# Patient Record
Sex: Female | Born: 1975 | Race: White | Hispanic: No | Marital: Married | Smoking: Never smoker
Health system: Southern US, Community
[De-identification: ages and names within clinical notes are randomized; demographics above are authoritative.]

---

## 2018-07-28 ENCOUNTER — Emergency Department
Admission: EM | Admit: 2018-07-28 | Discharge: 2018-07-28 | Disposition: A | Discharge status: Home / Self Care | Payer: Self-pay | Source: Home / Self Care | Attending: Family Medicine | Admitting: Family Medicine

## 2018-07-28 ENCOUNTER — Other Ambulatory Visit: Payer: Self-pay

## 2018-07-28 DIAGNOSIS — J029 Acute pharyngitis, unspecified: Secondary | ICD-10-CM

## 2018-07-28 DIAGNOSIS — R69 Illness, unspecified: Secondary | ICD-10-CM

## 2018-07-28 DIAGNOSIS — J111 Influenza due to unidentified influenza virus with other respiratory manifestations: Secondary | ICD-10-CM

## 2018-07-28 LAB — POCT RAPID STREP A (OFFICE): Rapid Strep A Screen: NEGATIVE

## 2018-07-28 MED ORDER — OSELTAMIVIR PHOSPHATE 75 MG PO CAPS: 75.0000 mg | ORAL_CAPSULE | Freq: Two times a day (BID) | ORAL | 0 refills | Status: DC

## 2018-07-28 MED ORDER — BENZONATATE 200 MG PO CAPS: ORAL_CAPSULE | ORAL | 0 refills | Status: DC

## 2018-07-28 NOTE — Discharge Instructions (Addendum)
Take plain guaifenesin (1200mg  extended release tabs such as Mucinex) twice daily, with plenty of water, for cough and congestion.  May add Pseudoephedrine (30mg , one or two every 4 to 6 hours) for sinus congestion.  Get adequate rest.   May use Afrin nasal spray (or generic oxymetazoline) each morning for about 5 days and then discontinue.  Also recommend using saline nasal spray several times daily and saline nasal irrigation (AYR is a common brand).  Use Flonase nasal spray each morning after using Afrin nasal spray and saline nasal irrigation. Try warm salt water gargles for sore throat.  Stop all antihistamines for now, and other non-prescription cough/cold preparations. May take Ibuprofen 200mg , 4 tabs every 8 hours with food for body aches, headache, fever. May take Delsym Cough Suppressant with Tessalon at bedtime for nighttime cough.

## 2018-07-28 NOTE — ED Triage Notes (Signed)
Pt c/o flu like sxs since Wednesday. Throat feels very raw from coughing. Requested rapid strep as well to r/o strep since she works in a [employer].

## 2018-07-28 NOTE — ED Provider Notes (Signed)
[facility]    CSN: [id number] Arrival date & time: 07/28/18  1546     History   Chief Complaint Chief Complaint  Patient presents with  . Sore Throat    HPI Meghan Arias is a 43 y.o. female.   Three days ago patient developed myalgias, light-headedness, headache, fatigue, non-productive cough, and fever.  She now has a sore throat when coughing, and has had fever to 101.8.     History reviewed. No pertinent past medical history.  There are no active problems to display for this patient.   History reviewed. No pertinent surgical history.  OB History   No obstetric history on file.      Home Medications    Prior to Admission medications   Medication Sig Start Date End Date Taking? Authorizing Provider  benzonatate (TESSALON) 200 MG capsule Take one cap by mouth at bedtime as needed for cough.  May repeat in 4 to 6 hours 07/28/18   Lattie Haw, MD  oseltamivir (TAMIFLU) 75 MG capsule Take 1 capsule (75 mg total) by mouth every 12 (twelve) hours. 07/28/18   Lattie Haw, MD    Family History History reviewed. No pertinent family history.  Social History Social History   Tobacco Use  . Smoking status: Never Smoker  . Smokeless tobacco: Never Used  Substance Use Topics  . Alcohol use: Never    Frequency: Never  . Drug use: Not on file     Allergies   Patient has no known allergies.   Review of Systems Review of Systems + sore throat + cough No pleuritic pain No wheezing + nasal congestion + post-nasal drainage No sinus pain/pressure No itchy/red eyes No earache No hemoptysis No SOB + fever, + chills No nausea No vomiting No abdominal pain No diarrhea No urinary symptoms No skin rash + fatigue + myalgias + headache Used OTC meds without relief   Physical Exam Triage Vital Signs ED Triage Vitals [07/28/18 1612]  Enc Vitals Group     BP 121/87     Pulse Rate 89     Resp      Temp 98.2 F (36.8 C)     Temp  Source Oral     SpO2 100 %     Weight 241 lb (109.3 kg)     Height 5\' 5"  (1.651 m)     Head Circumference      Peak Flow      Pain Score 0     Pain Loc      Pain Edu?      Excl. in GC?    No data found.  Updated Vital Signs BP 121/87 (BP Location: Right Arm)   Pulse 89   Temp 98.2 F (36.8 C) (Oral)   Ht 5\' 5"  (1.651 m)   Wt 109.3 kg   LMP  (LMP Unknown)   SpO2 100%   BMI 40.10 kg/m   Visual Acuity Right Eye Distance:   Left Eye Distance:   Bilateral Distance:    Right Eye Near:   Left Eye Near:    Bilateral Near:     Physical Exam Nursing notes and Vital Signs reviewed. Appearance:  Patient appears stated age, and in no acute distress Eyes:  Pupils are equal, round, and reactive to light and accomodation.  Extraocular movement is intact.  Conjunctivae are not inflamed  Ears:  Canals normal.  Tympanic membranes normal.  Nose:  Mildly congested turbinates.  No sinus tenderness.  Pharynx:   Minimal erythema. Neck:  Supple.  Enlarged posterior/lateral nodes are palpated bilaterally, tender to palpation on the left.   Lungs:  Clear to auscultation.  Breath sounds are equal.  Moving air well. Heart:  Regular rate and rhythm without murmurs, rubs, or gallops.  Abdomen:  Nontender without masses or hepatosplenomegaly.  Bowel sounds are present.  No CVA or flank tenderness.  Extremities:  No edema.  Skin:  No rash present.    UC Treatments / Results  Labs (all labs ordered are listed, but only abnormal results are displayed) Labs Reviewed  STREP A DNA PROBE  POCT RAPID STREP A (OFFICE) negative    EKG None  Radiology No results found.  Procedures Procedures (including critical care time)  Medications Ordered in UC Medications - No data to display  Initial Impression / Assessment and Plan / UC Course  I have reviewed the triage vital signs and the nursing notes.  Pertinent labs & imaging results that were available during my care of the patient were  reviewed by me and considered in my medical decision making (see chart for details).    Begin Tamiflu. Prescription written for Benzonatate [facility]) to take at bedtime for night-time cough.  Followup with Family Doctor if not improved in about 5 days.  Final Clinical Impressions(s) / UC Diagnoses   Final diagnoses:  Acute pharyngitis, unspecified etiology  Influenza-like illness     Discharge Instructions     Take plain guaifenesin (1200mg  extended release tabs such as Mucinex) twice daily, with plenty of water, for cough and congestion.  May add Pseudoephedrine (30mg , one or two every 4 to 6 hours) for sinus congestion.  Get adequate rest.   May use Afrin nasal spray (or generic oxymetazoline) each morning for about 5 days and then discontinue.  Also recommend using saline nasal spray several times daily and saline nasal irrigation (AYR is a common brand).  Use Flonase nasal spray each morning after using Afrin nasal spray and saline nasal irrigation. Try warm salt water gargles for sore throat.  Stop all antihistamines for now, and other non-prescription cough/cold preparations. May take Ibuprofen 200mg , 4 tabs every 8 hours with food for body aches, headache, fever. May take Delsym Cough Suppressant with Tessalon at bedtime for nighttime cough.     ED Prescriptions    Medication Sig Dispense Auth. Provider   oseltamivir (TAMIFLU) 75 MG capsule Take 1 capsule (75 mg total) by mouth every 12 (twelve) hours. 10 capsule Lattie Haw, MD   benzonatate (TESSALON) 200 MG capsule Take one cap by mouth at bedtime as needed for cough.  May repeat in 4 to 6 hours 15 capsule Cathren Harsh Tera Mater, MD        Lattie Haw, MD 08/09/18 [phone]

## 2018-07-29 ENCOUNTER — Telehealth: Payer: Self-pay

## 2018-07-29 LAB — STREP A DNA PROBE: Group A Strep Probe: NOT DETECTED

## 2018-07-29 NOTE — Telephone Encounter (Signed)
Spoke with patient, doing about the same.  Will follow up as needed.

## 2018-09-26 ENCOUNTER — Other Ambulatory Visit: Payer: Self-pay

## 2018-09-26 ENCOUNTER — Emergency Department (INDEPENDENT_AMBULATORY_CARE_PROVIDER_SITE_OTHER): Payer: Self-pay

## 2018-09-26 ENCOUNTER — Encounter: Payer: Self-pay | Admitting: Emergency Medicine

## 2018-09-26 ENCOUNTER — Emergency Department
Admission: EM | Admit: 2018-09-26 | Discharge: 2018-09-26 | Disposition: A | Discharge status: Home / Self Care | Payer: Self-pay | Source: Home / Self Care | Attending: Family Medicine | Admitting: Family Medicine

## 2018-09-26 DIAGNOSIS — K760 Fatty (change of) liver, not elsewhere classified: Secondary | ICD-10-CM

## 2018-09-26 DIAGNOSIS — M7918 Myalgia, other site: Secondary | ICD-10-CM

## 2018-09-26 DIAGNOSIS — R0781 Pleurodynia: Secondary | ICD-10-CM

## 2018-09-26 LAB — POCT CBC W AUTO DIFF (K'VILLE URGENT CARE)

## 2018-09-26 LAB — POCT URINALYSIS DIP (MANUAL ENTRY)
Bilirubin, UA: NEGATIVE
Blood, UA: NEGATIVE
Glucose, UA: NEGATIVE mg/dL
Ketones, POC UA: NEGATIVE mg/dL
Leukocytes, UA: NEGATIVE
Nitrite, UA: NEGATIVE
Protein Ur, POC: NEGATIVE mg/dL
Spec Grav, UA: 1.015 (ref 1.010–1.025)
Urobilinogen, UA: 0.2 E.U./dL
pH, UA: 5.5 (ref 5.0–8.0)

## 2018-09-26 MED ORDER — IOHEXOL 300 MG/ML  SOLN
100.0000 mL | Freq: Once | INTRAMUSCULAR | Status: AC | PRN
  Administered 2018-09-26: 16:00:00 100 mL via INTRAVENOUS

## 2018-09-26 NOTE — ED Triage Notes (Signed)
Pain under LT breast, 1 month intermittently, sharp pain

## 2018-09-26 NOTE — Discharge Instructions (Signed)
Wear elastic brace on abdomen.
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
## 2018-09-27 ENCOUNTER — Telehealth: Payer: Self-pay

## 2018-09-27 LAB — COMPLETE METABOLIC PANEL WITH GFR
AG Ratio: 1.8 (calc) (ref 1.0–2.5)
ALT: 43 U/L — ABNORMAL HIGH (ref 6–29)
AST: 32 U/L — ABNORMAL HIGH (ref 10–30)
Albumin: 4.4 g/dL (ref 3.6–5.1)
Alkaline phosphatase (APISO): 43 U/L (ref 31–125)
BUN: 12 mg/dL (ref 7–25)
CO2: 29 mmol/L (ref 20–32)
Calcium: 9 mg/dL (ref 8.6–10.2)
Chloride: 102 mmol/L (ref 98–110)
Creat: 0.71 mg/dL (ref 0.50–1.10)
GFR, Est African American: 120 mL/min/{1.73_m2} (ref >=60)
GFR, Est Non African American: 104 mL/min/{1.73_m2} (ref >=60)
Globulin: 2.5 g/dL (calc) (ref 1.9–3.7)
Glucose, Bld: 80 mg/dL (ref 65–99)
Potassium: 4.9 mmol/L (ref 3.5–5.3)
Sodium: 136 mmol/L (ref 135–146)
Total Bilirubin: 0.4 mg/dL (ref 0.2–1.2)
Total Protein: 6.9 g/dL (ref 6.1–8.1)

## 2018-09-27 LAB — EXTRA LAV TOP TUBE

## 2018-09-27 NOTE — Telephone Encounter (Signed)
Left voice message inquiring about patients status. Given lab results. Encouraged patient to call with questions or concerns.  

## 2019-10-16 IMAGING — CT CT ABDOMEN AND PELVIS WITH CONTRAST
2 of 5 series · 16 of 46 positions shown, 18 images · IV contrast (APPLIED)
Comparison: None.

CLINICAL DATA: Pain and bulging of the left breast when straining
or lifting for the past month.

EXAM:
CT ABDOMEN AND PELVIS WITH CONTRAST
TECHNIQUE: Multidetector CT imaging of the abdomen and pelvis was performed
using the standard protocol following bolus administration of
intravenous contrast.
CONTRAST:  100mL OMNIPAQUE IOHEXOL 300 MG/ML  SOLN

[Series 2: axial st · axial · 0.97mm/px · z∈[-636,-136]mm · 13 of 114 slices shown, 15 images]
[im 7/114  soft-tissue]
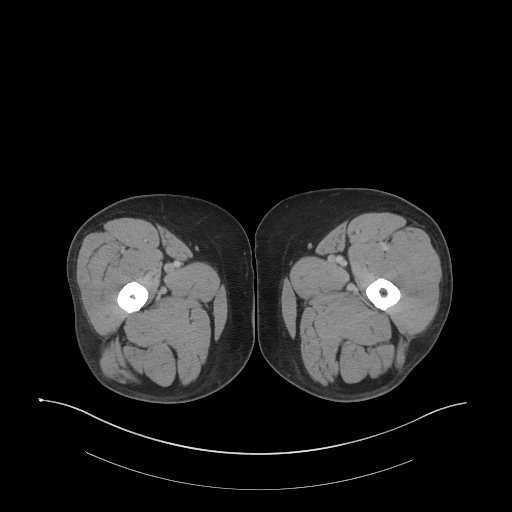
[im 7/114  bone]
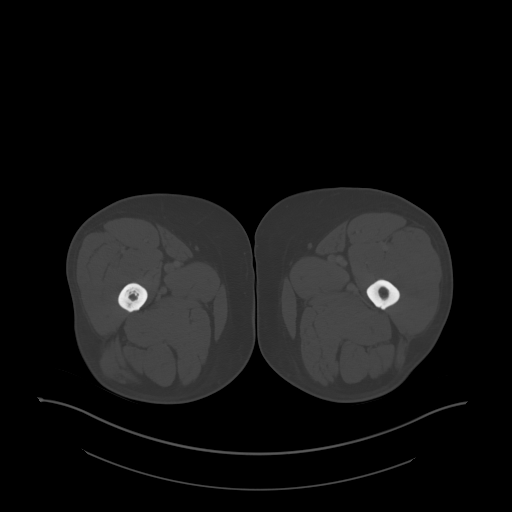
[im 13/114  soft-tissue]
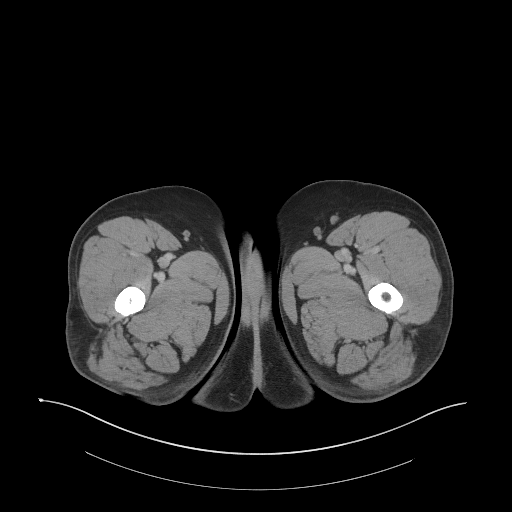
[im 26/114  soft-tissue]
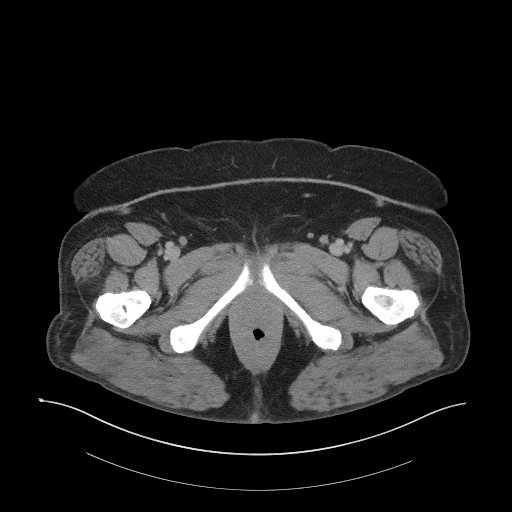
[im 32/114  soft-tissue]
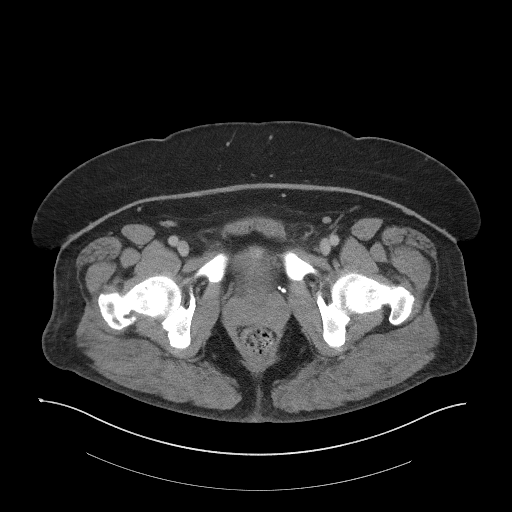
[im 38/114  soft-tissue]
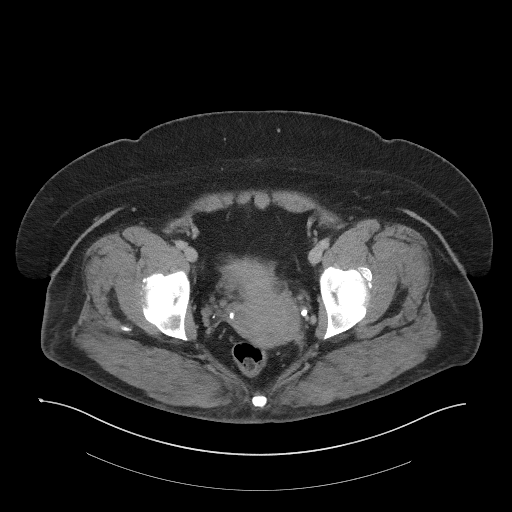
[im 51/114  soft-tissue]
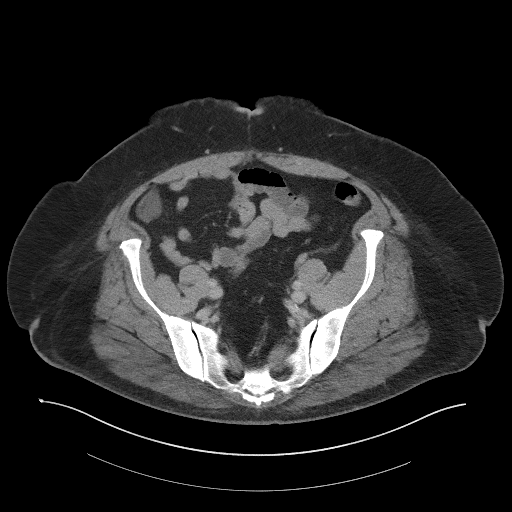
[im 57/114  soft-tissue]
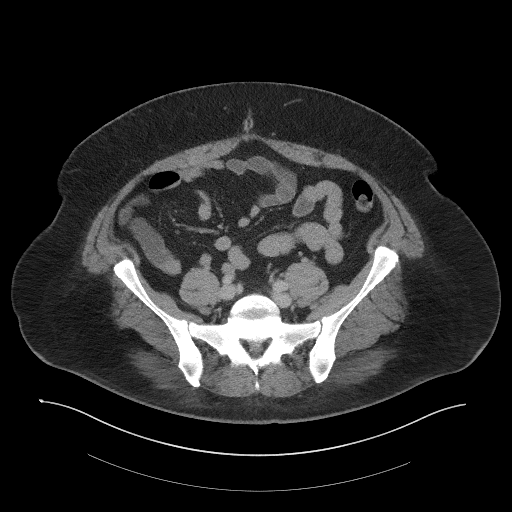
[im 63/114  soft-tissue]
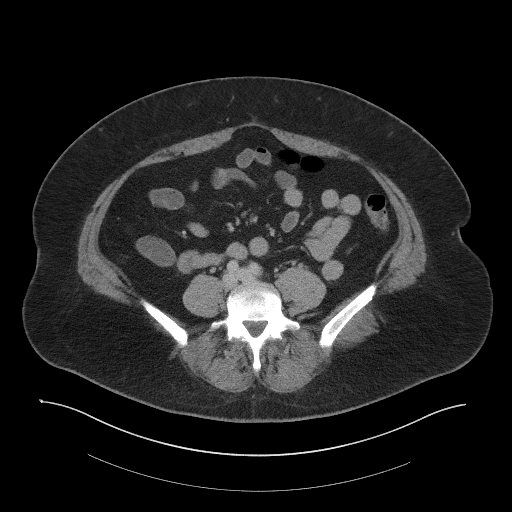
[im 76/114  soft-tissue]
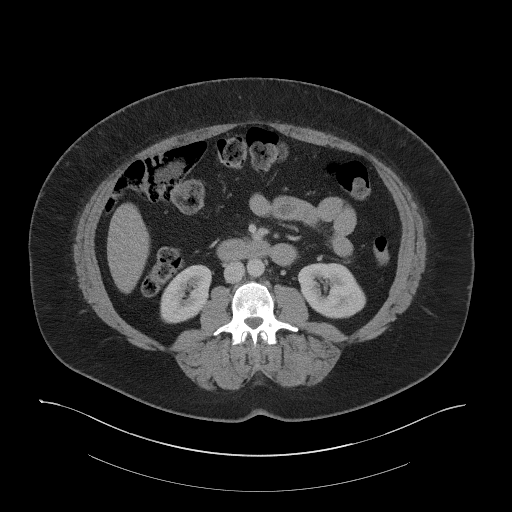
[im 76/114  bone]
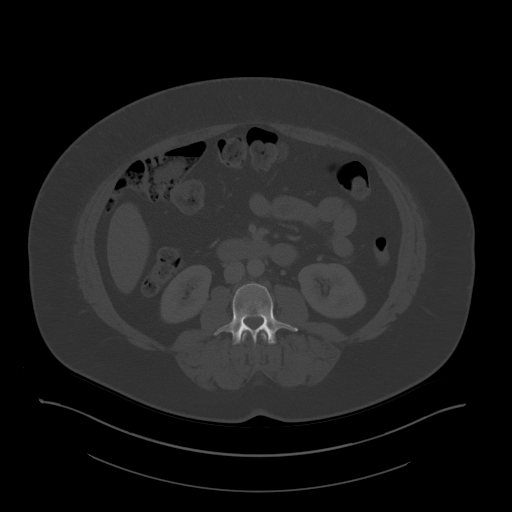
[im 82/114  soft-tissue]
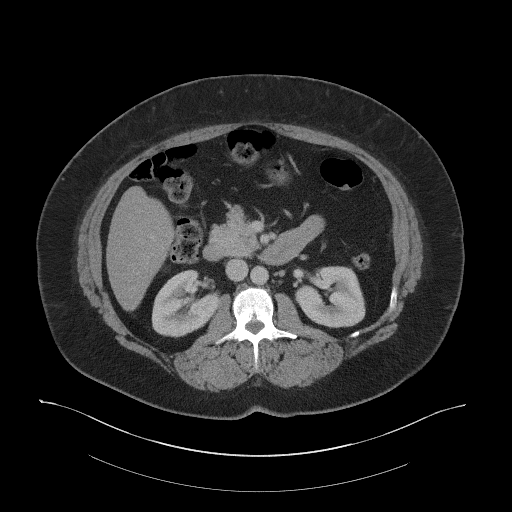
[im 88/114  soft-tissue]
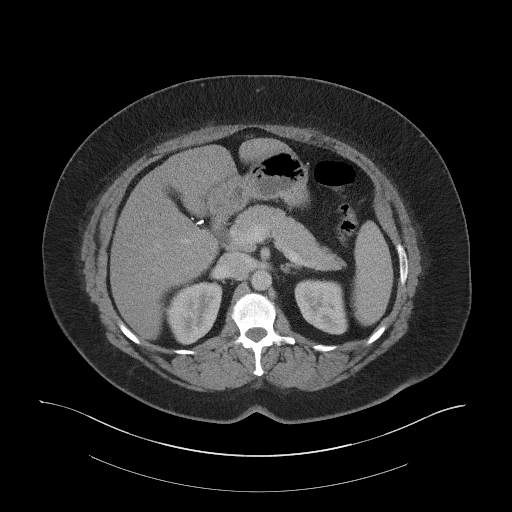
[im 101/114  soft-tissue]
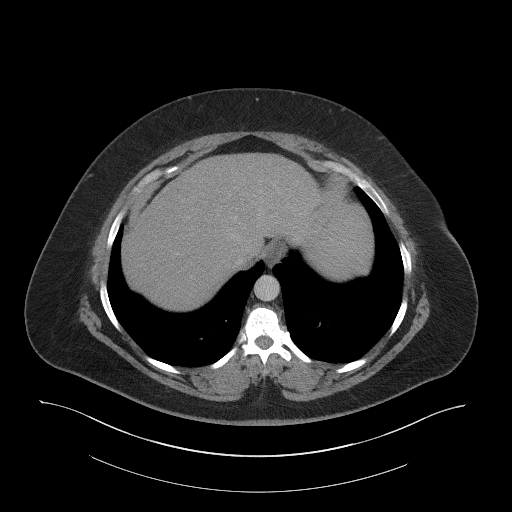
[im 107/114  soft-tissue]
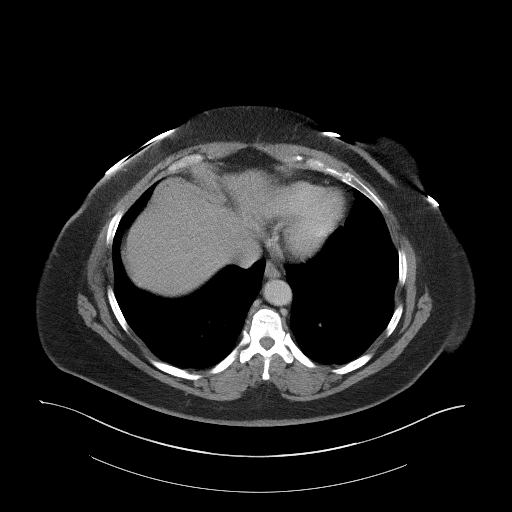

[Series 4: coronal st · coronal · 0.87mm/px · 3 of 113 slices shown]
[im 38/113  soft-tissue]
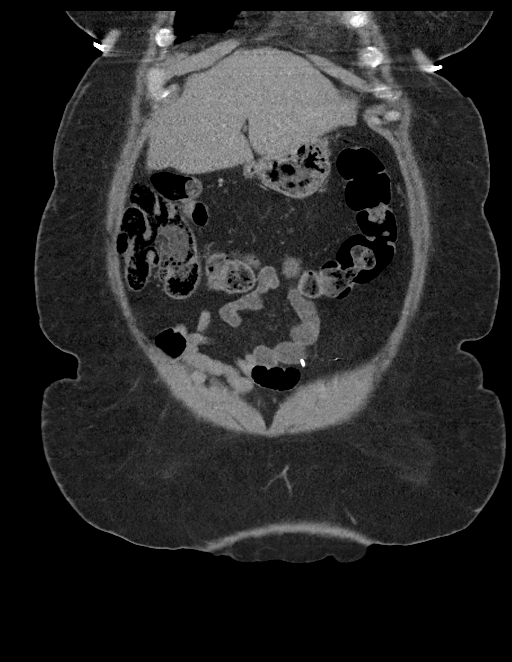
[im 50/113  soft-tissue]
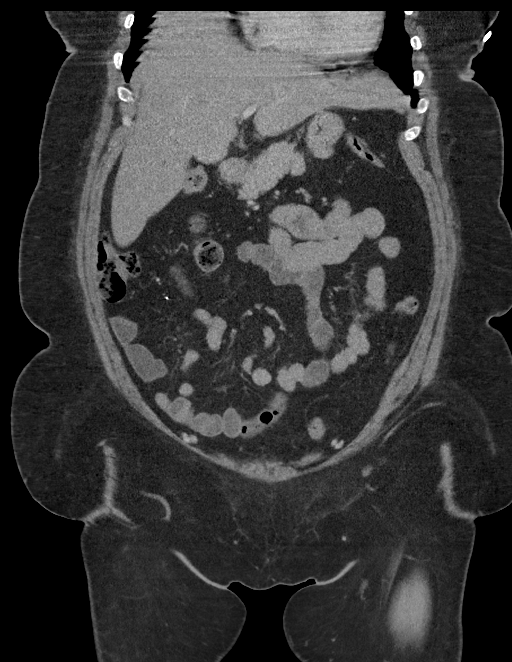
[im 63/113  soft-tissue]
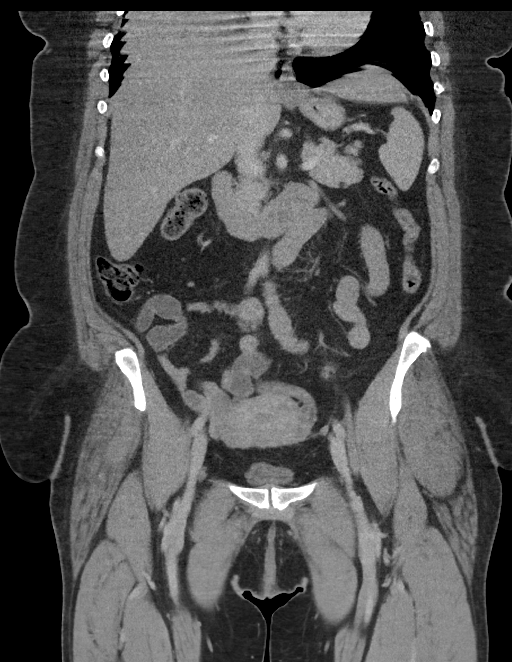

[16 of 46 positions shown; findings below may reference images not displayed]

FINDINGS: Lower chest: No acute abnormality.

Hepatobiliary: Hepatic steatosis. No focal liver abnormality. Status
post cholecystectomy. No biliary dilatation.

Pancreas: Unremarkable. No pancreatic ductal dilatation or
surrounding inflammatory changes.

Spleen: Normal in size without focal abnormality.

Adrenals/Urinary Tract: The adrenal glands are unremarkable. Tiny
low-density lesion in the right kidney is too small to characterize.
No renal calculi or hydronephrosis. The bladder is decompressed.

Stomach/Bowel: Stomach is within normal limits. Prior appendectomy.
No evidence of bowel wall thickening, distention, or inflammatory
changes.

Vascular/Lymphatic: No significant vascular findings are present. No
enlarged abdominal or pelvic lymph nodes.

Reproductive: Uterus and bilateral adnexa are unremarkable.

Other: No abdominal wall hernia or abnormality. No abdominopelvic
ascites. No pneumoperitoneum.

Musculoskeletal: No acute or significant osseous findings. Old
intramedullary nail tract within the right proximal femur.
IMPRESSION: 1.  No acute intra-abdominal process.  No abdominal wall hernia.
2. Hepatic steatosis.
